# Patient Record
Sex: Female | Born: 1995 | Hispanic: No | Marital: Single
Health system: Southern US, Community
[De-identification: ages and names within clinical notes are randomized; demographics above are authoritative.]

---

## 2017-12-02 ENCOUNTER — Encounter: Payer: Self-pay | Admitting: Medical Oncology

## 2017-12-02 ENCOUNTER — Emergency Department: Payer: PRIVATE HEALTH INSURANCE

## 2017-12-02 ENCOUNTER — Emergency Department
Admission: EM | Admit: 2017-12-02 | Discharge: 2017-12-02 | Disposition: A | Discharge status: Home / Self Care | Payer: PRIVATE HEALTH INSURANCE | Attending: Emergency Medicine | Admitting: Emergency Medicine

## 2017-12-02 DIAGNOSIS — Y9389 Activity, other specified: Secondary | ICD-10-CM | POA: Diagnosis not present

## 2017-12-02 DIAGNOSIS — Y92015 Private garage of single-family (private) house as the place of occurrence of the external cause: Secondary | ICD-10-CM | POA: Insufficient documentation

## 2017-12-02 DIAGNOSIS — Y998 Other external cause status: Secondary | ICD-10-CM | POA: Diagnosis not present

## 2017-12-02 DIAGNOSIS — S51822A Laceration with foreign body of left forearm, initial encounter: Secondary | ICD-10-CM | POA: Diagnosis not present

## 2017-12-02 DIAGNOSIS — Z23 Encounter for immunization: Secondary | ICD-10-CM | POA: Diagnosis not present

## 2017-12-02 DIAGNOSIS — M795 Residual foreign body in soft tissue: Secondary | ICD-10-CM | POA: Insufficient documentation

## 2017-12-02 DIAGNOSIS — W268XXA Contact with other sharp object(s), not elsewhere classified, initial encounter: Secondary | ICD-10-CM | POA: Diagnosis not present

## 2017-12-02 MED ORDER — SULFAMETHOXAZOLE-TRIMETHOPRIM 800-160 MG PO TABS: 1.0000 | ORAL_TABLET | Freq: Two times a day (BID) | ORAL | 0 refills | Status: AC

## 2017-12-02 MED ORDER — ACETAMINOPHEN 500 MG PO TABS
1000.0000 mg | ORAL_TABLET | Freq: Once | ORAL | Status: AC
  Administered 2017-12-02: 1000 mg via ORAL
  Filled 2017-12-02: qty 2

## 2017-12-02 MED ORDER — TETANUS-DIPHTH-ACELL PERTUSSIS 5-2.5-18.5 LF-MCG/0.5 IM SUSP
0.5000 mL | Freq: Once | INTRAMUSCULAR | Status: AC
  Administered 2017-12-02: 0.5 mL via INTRAMUSCULAR
  Filled 2017-12-02: qty 0.5

## 2017-12-02 NOTE — ED Provider Notes (Signed)
[facility] Emergency Department Provider Note  ____________________________________________  Time seen: Approximately 11:11 AM  I have reviewed the triage vital signs and the nursing notes.   HISTORY  Chief Complaint Foreign Body    HPI Holly Hendricks is a 22 y.o. female that presents to the emergency department for evaluation of left forearm laceration.  Patient states that she cut herself on something unknown in a garage at a party last night.  She was showering this morning and it felt like there is still something in the laceration.  History reviewed. No pertinent past medical history.  There are no active problems to display for this patient.    Prior to Admission medications   Medication Sig Start Date End Date Taking? Authorizing Provider  sulfamethoxazole-trimethoprim (BACTRIM DS,SEPTRA DS) 800-160 MG tablet Take 1 tablet by mouth 2 (two) times daily. 12/02/17   Enid DerryWagner, Amariyana Heacox, PA-C    Allergies Rocephin [ceftriaxone]  No family history on file.  Social History Social History   Tobacco Use  . Smoking status: Not on file  Substance Use Topics  . Alcohol use: Not on file  . Drug use: Not on file     Review of Systems  Respiratory: No SOB. Gastrointestinal: No abdominal pain.  Musculoskeletal: Positive for forearm pain. Skin: Positive for laceration and ecchymosis. Neurological: Negative for numbness or tingling   ____________________________________________   PHYSICAL EXAM:  VITAL SIGNS: ED Triage Vitals  Enc Vitals Group     BP 12/02/17 0947 139/75     Pulse Rate 12/02/17 0947 90     Resp 12/02/17 0947 18     Temp 12/02/17 0947 98.3 F (36.8 C)     Temp Source 12/02/17 0947 Oral     SpO2 12/02/17 0947 97 %     Weight 12/02/17 0930 213 lb (96.6 kg)     Height 12/02/17 0930 5\' 3"  (1.6 m)     Head Circumference --      Peak Flow --      Pain Score 12/02/17 0930 4     Pain Loc --      Pain Edu? --      Excl. in GC? --       Constitutional: Alert and oriented. Well appearing and in no acute distress. Eyes: Conjunctivae are normal. PERRL. EOMI. Head: Atraumatic. ENT:      Ears:      Nose: No congestion/rhinnorhea.      Mouth/Throat: Mucous membranes are moist.  Neck: No stridor.  Cardiovascular: Normal rate, regular rhythm.  Good peripheral circulation. Respiratory: Normal respiratory effort without tachypnea or retractions. Lungs CTAB. Good air entry to the bases with no decreased or absent breath sounds. Musculoskeletal: Full range of motion to all extremities. No gross deformities appreciated. Neurologic:  Normal speech and language. No gross focal neurologic deficits are appreciated.  Skin:  Skin is warm, dry.  One 4 cm laceration to left forearm with 1 cm surrounding erythema.  Slight induration proximal to laceration, possibly foreign body. Psychiatric: Mood and affect are normal. Speech and behavior are normal. Patient exhibits appropriate insight and judgement.   ____________________________________________   LABS (all labs ordered are listed, but only abnormal results are displayed)  Labs Reviewed - No data to display ____________________________________________  EKG   ____________________________________________  RADIOLOGY Lexine BatonI, Cheris Tweten, personally viewed and evaluated these images (plain radiographs) as part of my medical decision making, as well as reviewing the written report by the radiologist.  Dg Forearm Right  Result  Date: 12/02/2017 CLINICAL DATA:  Recent fall with possible foreign body EXAM: RIGHT FOREARM - 2 VIEW COMPARISON:  None. FINDINGS: Soft tissue changes are noted with the current history of laceration. No radiopaque foreign body is identified. No acute fracture is seen. IMPRESSION: Soft tissue irregularity consistent with the given clinical history. No bony abnormality or foreign body is seen. Electronically Signed   By: Alcide Clever M.D.   On: 12/02/2017 10:47    [place] Rt Upper Extrem Ltd Soft Tissue Non Vascular  Result Date: 12/02/2017 CLINICAL DATA:  Possible foreign body. EXAM: ULTRASOUND RIGHT UPPER EXTREMITY LIMITED TECHNIQUE: Ultrasound examination of the upper extremity soft tissues was performed in the area of clinical concern. COMPARISON:  Right forearm x-rays from same day. FINDINGS: Focused ultrasound in the area of clinical concern along the anterior mid forearm demonstrates a 2 x 3 mm area of ill-defined, shadowing echogenicities in the superficial subcutaneous fat, approximately 2.5 cm deep to the skin surface. There is a small amount of surrounding fluid with a tract extending to the skin surface. No discrete fluid collection identified. IMPRESSION: 1. 2 x 3 mm area of ill-defined, shadowing echogenicities in the superficial subcutaneous fat of the volar mid forearm, suspicious for small foreign bodies. These are approximately 2.5 cm deep to the skin surface. 2. Small amount of surrounding fluid tracking to the skin surface without discrete fluid collection. Electronically Signed   By: Obie Dredge M.D.   On: 12/02/2017 12:02    ____________________________________________    PROCEDURES  Procedure(s) performed:    Procedures    Medications  Tdap (BOOSTRIX) injection 0.5 mL (0.5 mLs Intramuscular Given 12/02/17 1329)  acetaminophen (TYLENOL) tablet 1,000 mg (1,000 mg Oral Given 12/02/17 1328)     ____________________________________________   INITIAL IMPRESSION / ASSESSMENT AND PLAN / ED COURSE  Pertinent labs & imaging results that were available during my care of the patient were reviewed by me and considered in my medical decision making (see chart for details).  Review of the [place] CSRS was performed in accordance of the [facility] prior to dispensing any controlled drugs.     Patient presented to emergency department for evaluation of possible foreign body to left forearm.  X-ray negative for foreign body.  Ultrasound  consistent with possible small foreign bodies. Dr. Scotty Court was consulted, reviewed images and recommended consult to general surgery.  Dr. Earlene Plater with general surgery was consulted, reviewed images, and does not recommend further work-up or further exploration of wound for possible foreign body today.  He recommends that the patient follow-up with him in clinic this week if symptoms continue or signs of infection. Tetanus shot was updated. Patient will be discharged home with prescriptions for bactrim. Patient is to follow up with general surgery as directed. Patient is given ED precautions to return to the ED for any worsening or new symptoms.     ____________________________________________  FINAL CLINICAL IMPRESSION(S) / ED DIAGNOSES  Final diagnoses:  Foreign body (FB) in soft tissue      NEW MEDICATIONS STARTED DURING THIS VISIT:  ED Discharge Orders         Ordered    sulfamethoxazole-trimethoprim (BACTRIM DS,SEPTRA DS) 800-160 MG tablet  2 times daily     12/02/17 1352              This chart was dictated using voice recognition software/Dragon. Despite best efforts to proofread, errors can occur which can change the meaning. Any change was purely unintentional.    Enid Derry,  PA-C 12/02/17 1539    Sharman Cheek, MD 12/02/17 2025

## 2017-12-02 NOTE — ED Triage Notes (Signed)
Pt reports that she was at a party last night and fell, reports she was showering this am and feels like she has something in her rt forearm.

## 2017-12-02 NOTE — Discharge Instructions (Addendum)
Please follow-up with Dr. Earlene Plateravis if you continue to feel like there is a foreign body in laceration or if your laceration shows any signs of infection.  Begin antibiotics to help prevent an infection.

## 2017-12-02 NOTE — ED Notes (Signed)
See triage note  States she fell while at a party last pm  Unsure of what she did to her arm  small laceration noted with bruising and swelling noted

## 2019-10-10 IMAGING — DX DG FOREARM 2V*R*
2 series · 2 of 2 positions shown · non-contrast
Comparison: None.

CLINICAL DATA: Recent fall with possible foreign body

EXAM:
RIGHT FOREARM - 2 VIEW

[forearm ap]
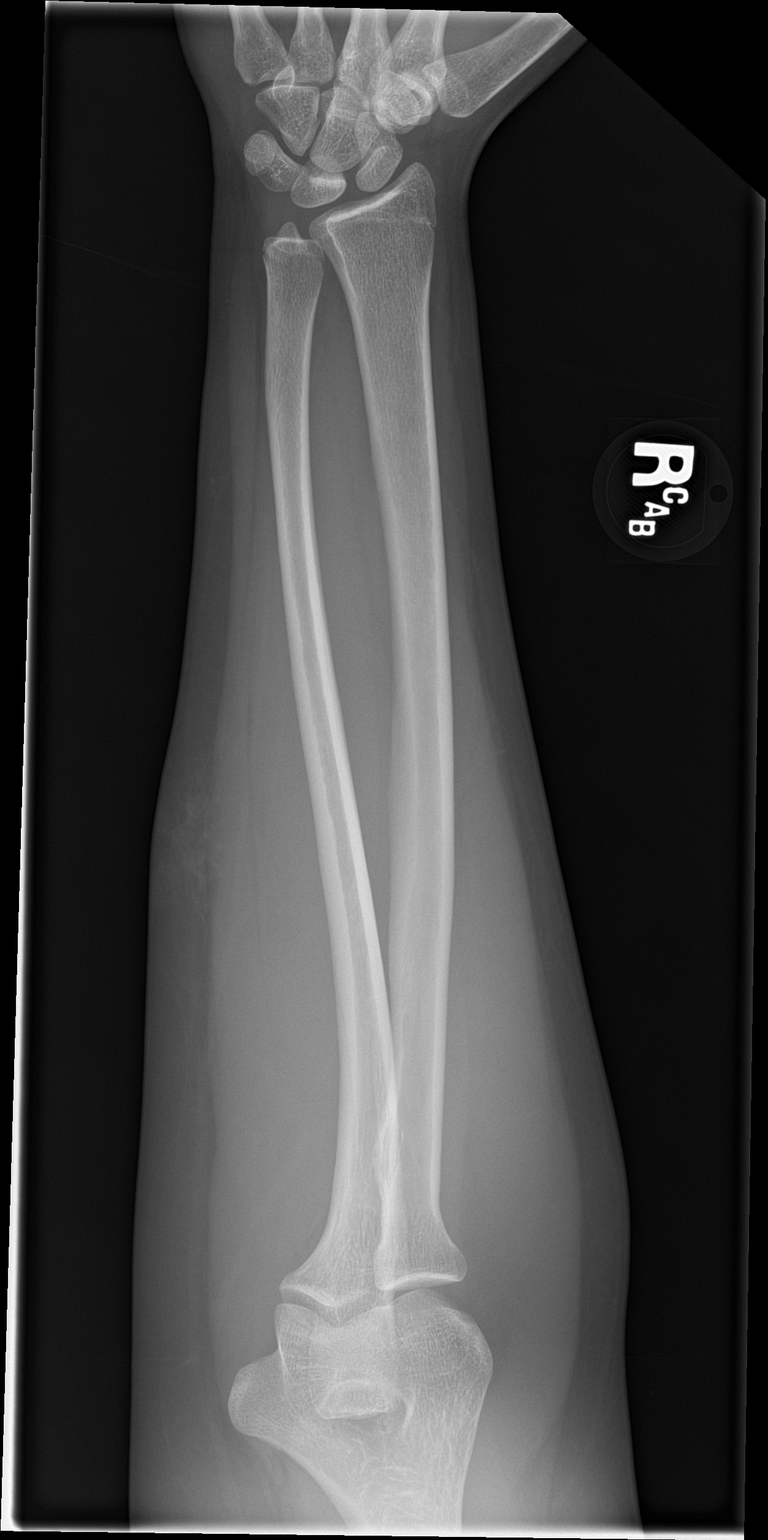

[forearm lat]
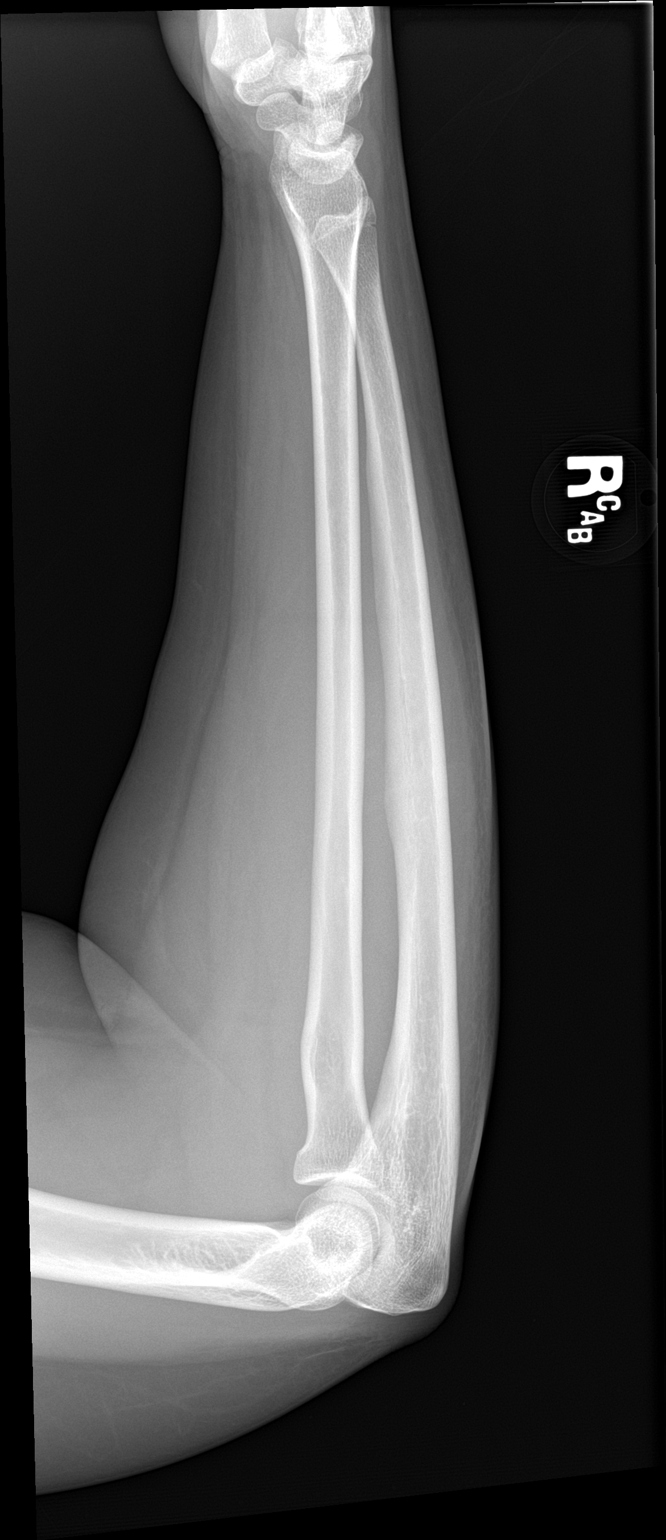

[2 of 2 positions shown; findings below may reference images not displayed]

FINDINGS: Soft tissue changes are noted with the current history of
laceration. No radiopaque foreign body is identified. No acute
fracture is seen.
IMPRESSION: Soft tissue irregularity consistent with the given clinical history.
No bony abnormality or foreign body is seen.

## 2019-10-10 IMAGING — US US EXTREM UP *R* LTD
1 series · 14 of 17 positions shown · non-contrast
Comparison: Right forearm x-rays from same day.

CLINICAL DATA: Possible foreign body.

EXAM:
ULTRASOUND RIGHT UPPER EXTREMITY LIMITED
TECHNIQUE: Ultrasound examination of the upper extremity soft tissues was
performed in the area of clinical concern.

[Series 1: us extrem up *right* ltd · 17 acquisitions, 14 frames shown]
[im 1/17]
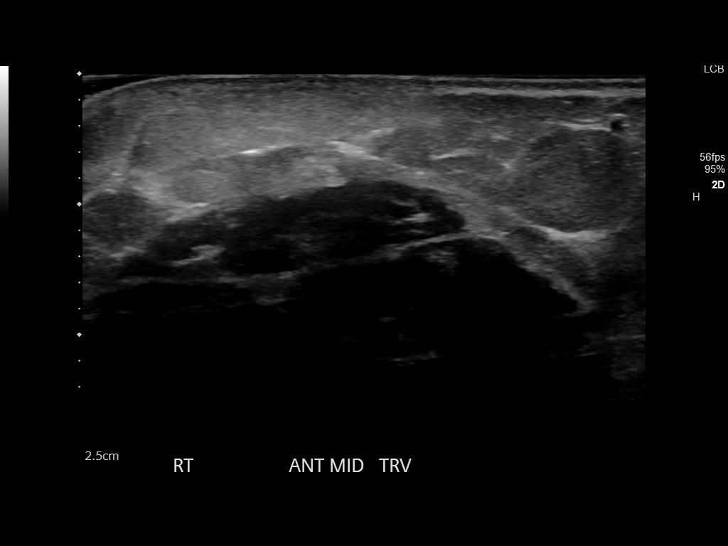
[im 2/17]
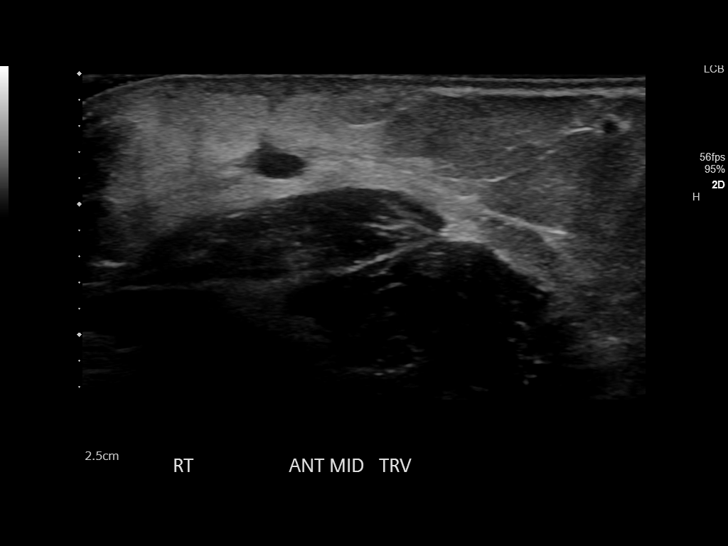
[im 4/17]
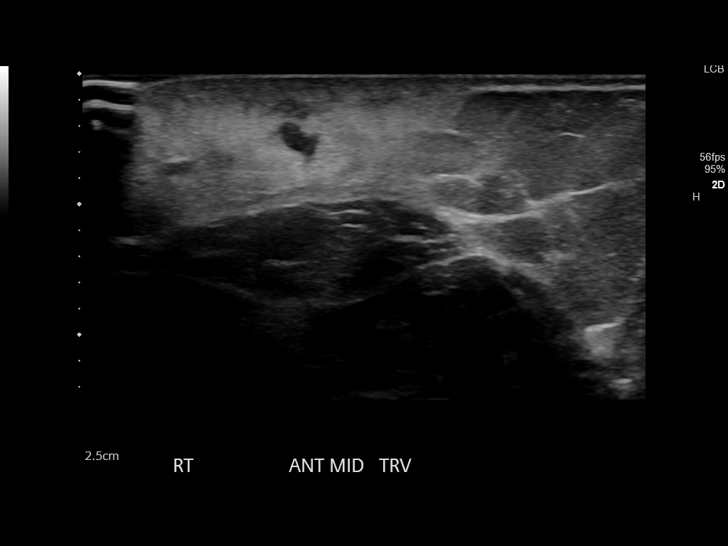
[im 5/17]
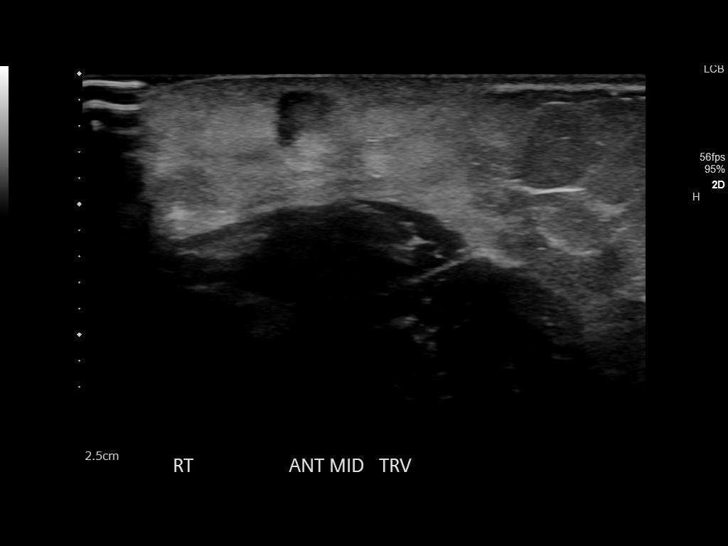
[im 6/17]
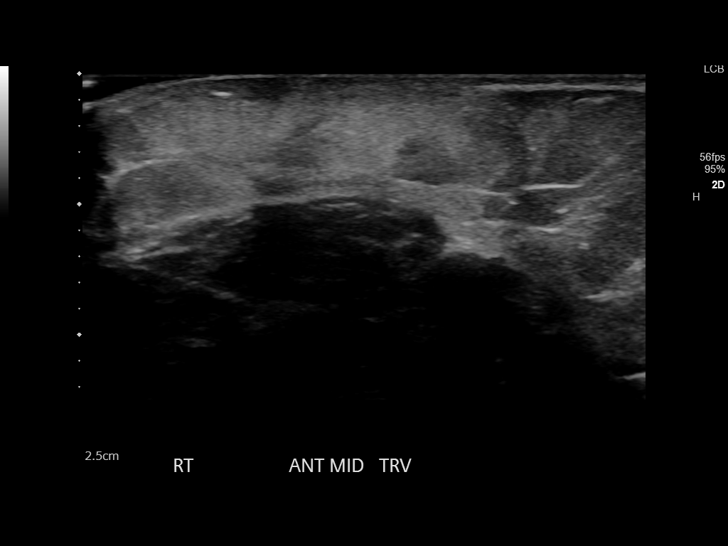
[im 7/17]
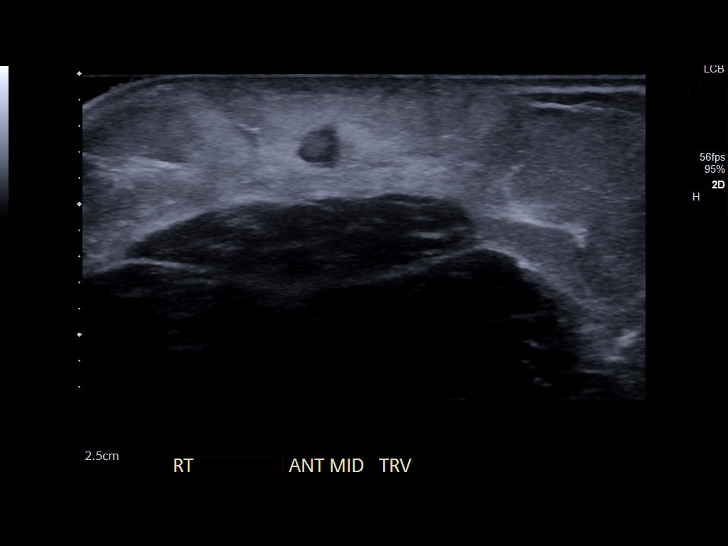
[im 8/17]
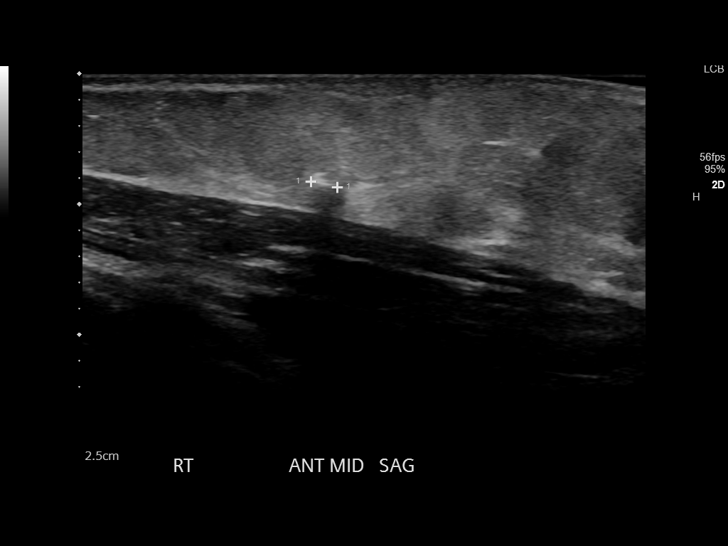
[im 10/17]
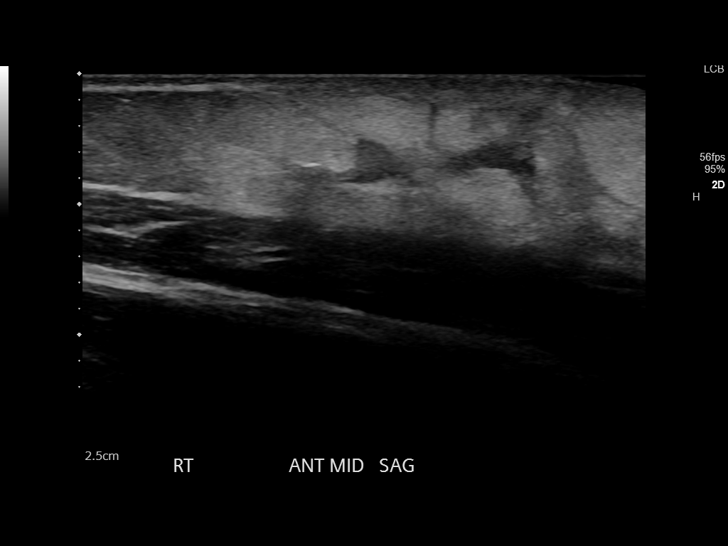
[im 11/17]
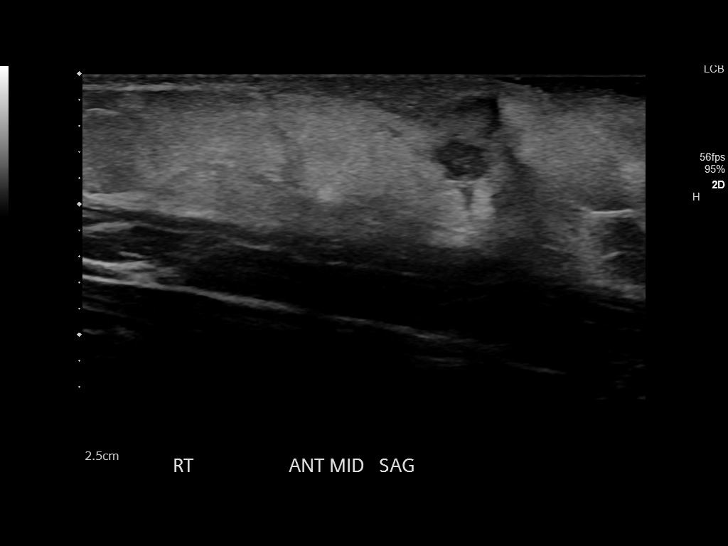
[im 12/17]
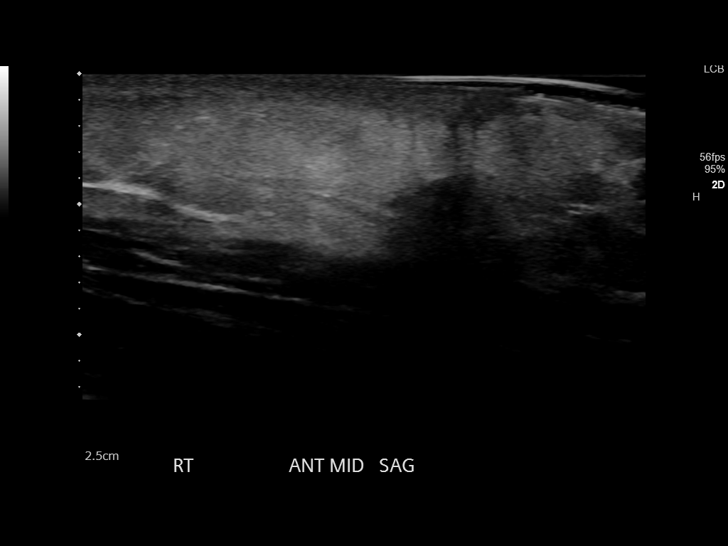
[im 13/17]
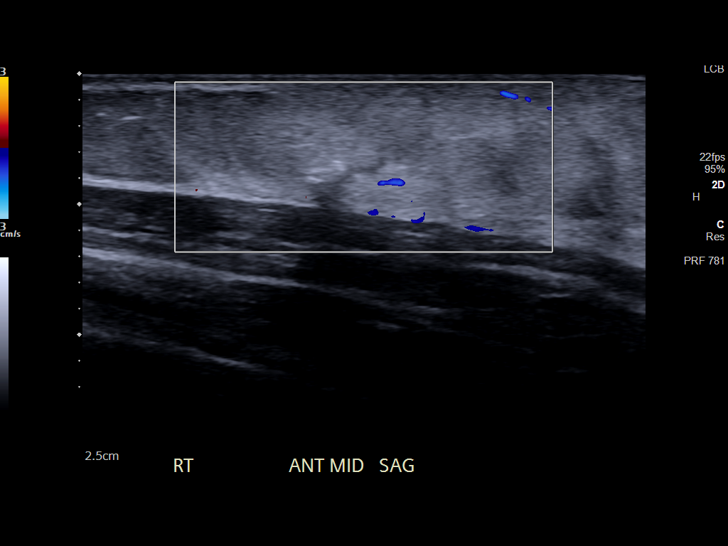
[im 14/17]
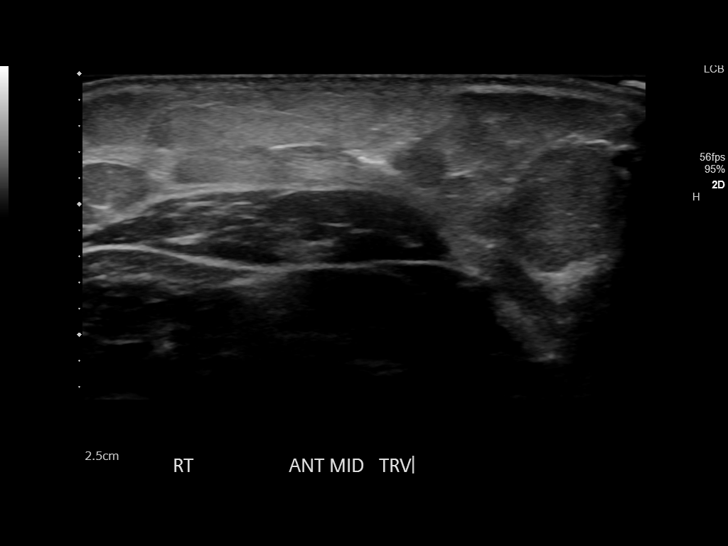
[im 16/17]
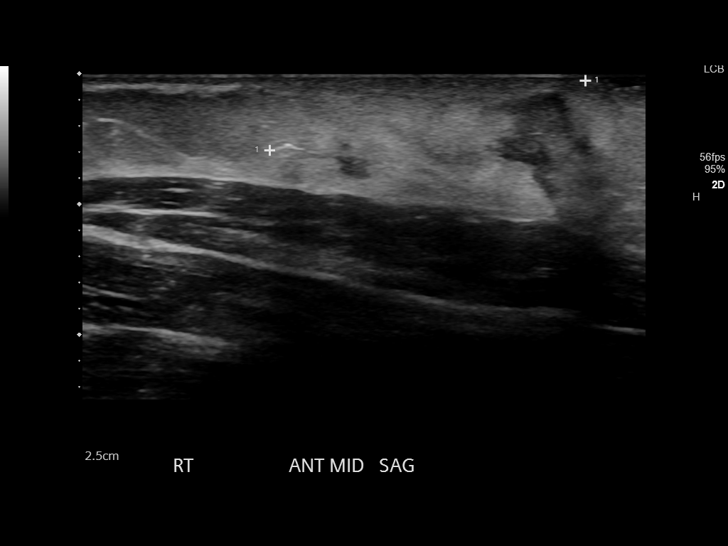
[im 17/17]
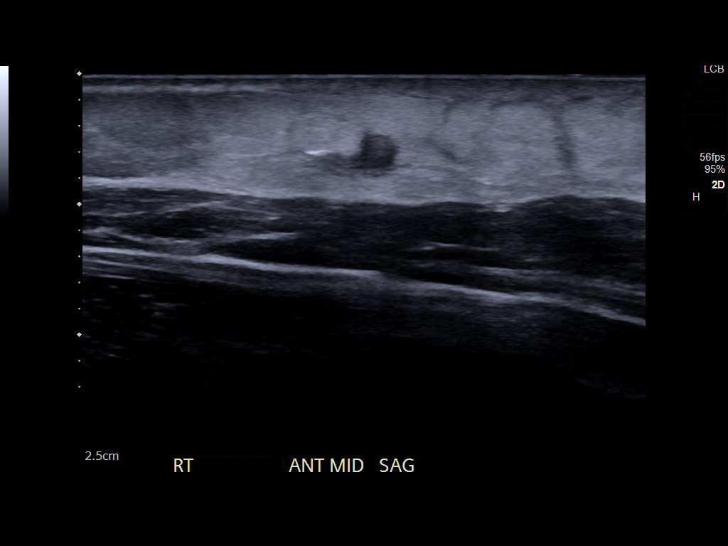

[14 of 17 positions shown; findings below may reference images not displayed]

FINDINGS: Focused ultrasound in the area of clinical concern along the
anterior mid forearm demonstrates a 2 x 3 mm area of ill-defined,
shadowing echogenicities in the superficial subcutaneous fat,
approximately 2.5 cm deep to the skin surface. There is a small
amount of surrounding fluid with a tract extending to the skin
surface. No discrete fluid collection identified.
IMPRESSION: 1. 2 x 3 mm area of ill-defined, shadowing echogenicities in the
superficial subcutaneous fat of the volar mid forearm, suspicious
for small foreign bodies. These are approximately 2.5 cm deep to the
skin surface.
2. Small amount of surrounding fluid tracking to the skin surface
without discrete fluid collection.
